# Patient Record
Sex: Male | Born: 2000
Health system: Southern US, Community
[De-identification: ages and names within clinical notes are randomized; demographics above are authoritative.]

## PROBLEM LIST (undated history)

## (undated) DIAGNOSIS — E119 Type 2 diabetes mellitus without complications: Secondary | ICD-10-CM

## (undated) HISTORY — DX: Type 2 diabetes mellitus without complications: E11.9

---
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
## 2021-02-18 ENCOUNTER — Telehealth (INDEPENDENT_AMBULATORY_CARE_PROVIDER_SITE_OTHER): Payer: Self-pay | Admitting: Family

## 2021-02-26 ENCOUNTER — Telehealth (INDEPENDENT_AMBULATORY_CARE_PROVIDER_SITE_OTHER): Payer: Self-pay

## 2021-02-26 NOTE — Telephone Encounter (Signed)
Faxed prescription for dexcom supplies to [facility]. 02/26/2021

## 2021-02-28 ENCOUNTER — Telehealth (INDEPENDENT_AMBULATORY_CARE_PROVIDER_SITE_OTHER): Payer: Self-pay | Admitting: Family

## 2021-02-28 NOTE — Telephone Encounter (Signed)
Returned call. LVM with call back number.  

## 2021-02-28 NOTE — Telephone Encounter (Signed)
Who's calling (name and relationship to patient) :[facility]   Best contact number:[phone]  Provider they JKD:TOIZTIW Beasely   Reason for call:Missing a diagnosis code for the Dexcom supplies. Please call for verbal [phone] or fax to - [phone]    Call ID:      PRESCRIPTION REFILL ONLY  Name of prescription:  Pharmacy:

## 2021-03-04 NOTE — Telephone Encounter (Signed)
Called and left message stating that they still do not have Dx code.  Mitzi Davenport was caller. Number: [phone]

## 2021-03-04 NOTE — Telephone Encounter (Signed)
LVM on [facility] VM and advised ICD 10 code of E10.9.

## 2021-04-07 ENCOUNTER — Ambulatory Visit (INDEPENDENT_AMBULATORY_CARE_PROVIDER_SITE_OTHER): Payer: 59 | Admitting: Family

## 2021-04-18 ENCOUNTER — Ambulatory Visit (INDEPENDENT_AMBULATORY_CARE_PROVIDER_SITE_OTHER): Payer: 59 | Admitting: Family

## 2021-04-18 ENCOUNTER — Other Ambulatory Visit: Payer: Self-pay

## 2021-04-18 ENCOUNTER — Encounter (INDEPENDENT_AMBULATORY_CARE_PROVIDER_SITE_OTHER): Payer: Self-pay | Admitting: Family

## 2021-04-18 VITALS — BP 114/70 | HR 72 | Wt 156.4 lb

## 2021-04-18 DIAGNOSIS — Z9641 Presence of insulin pump (external) (internal): Secondary | ICD-10-CM | POA: Diagnosis not present

## 2021-04-18 DIAGNOSIS — E10649 Type 1 diabetes mellitus with hypoglycemia without coma: Secondary | ICD-10-CM | POA: Diagnosis not present

## 2021-04-18 DIAGNOSIS — R739 Hyperglycemia, unspecified: Secondary | ICD-10-CM

## 2021-04-18 DIAGNOSIS — E1065 Type 1 diabetes mellitus with hyperglycemia: Secondary | ICD-10-CM

## 2021-04-18 DIAGNOSIS — E109 Type 1 diabetes mellitus without complications: Secondary | ICD-10-CM

## 2021-04-18 LAB — POCT GLYCOSYLATED HEMOGLOBIN (HGB A1C): Hemoglobin A1C: 6 % — AB (ref 4.0–5.6)

## 2021-04-18 LAB — POCT GLUCOSE (DEVICE FOR HOME USE): POC Glucose: 165 mg/dl — AB (ref 70–99)

## 2021-04-18 NOTE — Progress Notes (Signed)
\Pediatric Endocrinology Consultation Initial Visit  [place], [place] April 19, 2001  Danella Penton, MD  Chief Complaint: Type 1 diabetes  History obtained from: patient and mother, and review of records from PCP  HPI: Raymond [name]  is a 20 y.o. male being seen in consultation at the request of  Danella Penton, MD for evaluation of Type 1 Diabetes.  he is accompanied to this visit by his Mother.   Raymond [name] presents today to transfer care to Pediatric Specialist. In the past he has been care for at [facility]. He was diagnosed on 08/08/2007, he was initially on combination long acting and raipid acting insulin injections. He transitioned to insulin pump therapy 1 year after diagnosis. He feels that his diabetes has always been well controlled and he is very independent with his care. He is currently using Medtronic 670g insulin pump, he does not like the insulin pump and refuses to use the Guardian sensor.    2. Since his last visit to clinic on 12/2020 , Raymond [name] reports that he has been generally healthy.   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   Using Tandem Tslim insulin pump and Dexcom CGM. He reports things have been going well until one week ago when he had failed insulin pump site. Low blood sugars have been rare other then when he gets very active at work. He boluses before eating most of the time and is doing well with carb counting.    Insulin regimen: Medtronic insulin pump   Basal Rates 12AM 0.50  3am 0.65  10p 0.50           Insulin to Carbohydrate Ratio 12AM 6  10Am 6  5pm 6          Insulin Sensitivity Factor 12AM 50  3am 40  10pm 50          Target Blood Glucose 12AM 110                   Hypoglycemia: Sometimes he is unable to feel low blood sugars. Usually feels them when under 60. No glucagon  Insulin Pump download and   Dexcom CGM Download   Med-alert ID: Not currently  wearing. Injection sites: abdomen only   Annual labs due: 08/2021  Ophthalmology due: 2019 Discussed that he is overdue today and importance of yearly exam.      2. ROS: Greater than 10 systems reviewed with pertinent positives listed in HPI, otherwise neg. Constitutional: Sleeping well. Good energy and appetite.  Eyes: No changes in vision. No blurry vision. No glasses.  Ears/Nose/Mouth/Throat: No difficulty swallowing. No neck pain  Cardiovascular: No palpitations. No chest pain  Respiratory: No increased work of breathing. No SOB  Gastrointestinal: No constipation or diarrhea. No abdominal pain Genitourinary: No nocturia, no polyuria Musculoskeletal: No joint pain Neurologic: Normal sensation, no tremor Endocrine: No polyuria or polydipsia.  Psychiatric: Normal affect. Denies depression and anxiety.    Past Medical History:  Past Medical History:  Diagnosis Date   Diabetes mellitus without complication ([place])     Birth History: Pregnancy uncomplicated. Delivered at term Discharged home with mom  Meds: Outpatient Encounter Medications as of 04/18/2021  Medication Sig Note   insulin aspart (NOVOLOG) 100 UNIT/ML injection Use up 75 units per day    Glucagon (BAQSIMI TWO PACK) 3 MG/DOSE POWD Use in nostril in case of low blood sugar emergency (Patient not  taking: No sig reported)    Glucagon, rDNA, (GLUCAGON EMERGENCY) 1 MG KIT INJECT 1 MG IM ONCE (Patient not taking: No sig reported) 05/20/2020: PRN emergencies   glucose blood test strip USE TO TEST 6 TIMES DAILY AS DIRECTED (Patient not taking: No sig reported)    glucose blood test strip USE TO TEST 6 TIMES DAILY AS DIRECTED (Patient not taking: No sig reported)    Insulin Disposable Pump (OMNIPOD STARTER) KIT Use. Medtronic 670G (Patient not taking: No sig reported)    insulin glargine (LANTUS SOLOSTAR) 100 UNIT/ML Solostar Pen USE INCASE OF PUMP FAILURE INJECT UP TO 50 UNITS DAILY (Patient not taking: No sig reported)     Insulin Syringe-Needle U-100 31G X 15/64" 0.3 ML MISC Up to 4 injections per day if pump fails. (Patient not taking: No sig reported)    No facility-administered encounter medications on file as of 04/18/2021.    Allergies: No Known Allergies  Surgical History: No past surgical history on file.  Family History:  Family History  Problem Relation Age of Onset   Hyperlipidemia Mother    Cancer Maternal Grandmother    Cancer Maternal Grandfather     Social History: Lives with: Mother, Father and two siblings.  Freshman at [employer]   Physical Exam:  Vitals:   04/18/21 1014  BP: 114/70  Pulse: 72  Weight: 156 lb 6.4 oz (70.9 kg)    BP 114/70 (BP Location: Right Arm, Patient Position: Sitting, Cuff Size: Normal)   Pulse 72   Wt 156 lb 6.4 oz (70.9 kg)   BMI 24.32 kg/m  Body mass index: body mass index is 24.32 kg/m. Blood pressure percentiles are not available for patients who are 18 years or older.  Wt Readings from Last 3 Encounters:  04/18/21 156 lb 6.4 oz (70.9 kg) (57 %, Z= 0.17)*  12/24/20 147 lb 12.8 oz (67 kg) (45 %, Z= -0.13)*  10/17/20 150 lb 9.6 oz (68.3 kg) (51 %, Z= 0.02)*   * Growth percentiles are based on CDC (Boys, 2-20 Years) data.   Ht Readings from Last 3 Encounters:  10/17/20 5' 7.24" (1.708 m) (22 %, Z= -0.78)*  09/11/20 5' 7.17" (1.706 m) (21 %, Z= -0.80)*  05/20/20 5' 7.13" (1.705 m) (21 %, Z= -0.79)*   * Growth percentiles are based on CDC (Boys, 2-20 Years) data.   Body mass index is 24.32 kg/m. '@BMIFA' @ 57 %ile (Z= 0.17) based on CDC (Boys, 2-20 Years) weight-for-age data using vitals from 04/18/2021. No height on file for this encounter.   Physical Exam   General: Well developed, well nourished male in no acute distress.  Head: Normocephalic, atraumatic.   Eyes:  Pupils equal and round. EOMI.  Sclera white.  No eye drainage.   Ears/Nose/Mouth/Throat: Nares patent, no nasal drainage.  Normal dentition, mucous membranes moist.  Neck:  supple, no cervical lymphadenopathy, no thyromegaly Cardiovascular: regular rate, normal S1/S2, no murmurs Respiratory: No increased work of breathing.  Lungs clear to auscultation bilaterally.  No wheezes. Abdomen: soft, nontender, nondistended. Normal bowel sounds.  No appreciable masses  Extremities: warm, well perfused, cap refill < 2 sec.   Musculoskeletal: Normal muscle mass.  Normal strength Skin: warm, dry.  No rash or lesions. Neurologic: alert and oriented, normal speech, no tremor   Laboratory Evaluation: Hemoglobin A1c: 6.4% on 12/2020 Results for orders placed or performed in visit on 04/18/21  POCT glycosylated hemoglobin (Hb A1C)  Result Value Ref Range   Hemoglobin A1C 6.0 (A) 4.0 -  5.6 %   HbA1c POC (<> result, manual entry)     HbA1c, POC (prediabetic range)     HbA1c, POC (controlled diabetic range)    POCT Glucose (Device for Home Use)  Result Value Ref Range   Glucose Fasting, POC     POC Glucose 165 (A) 70 - 99 mg/dl       Assessment/Plan: Raymond [name] is a 20 y.o. male with type 1 diabetes in excellent control on Tandem Tslim insulin pump and Dexcom CGM. Doing an excellent job with diabetes control. Hemoglobin A1c is 6% today and TIR is >70%.   1-3. Type 1 diabetes without complication(HCC)/hyperglycemia/Hypoglycemia  - Reviewed insulin pump and CGM download. Discussed trends and patterns.  - Rotate pump sites to prevent scar tissue.  - bolus 15 minutes prior to eating to limit blood sugar spikes.  - Reviewed carb counting and importance of accurate carb counting.  - Discussed signs and symptoms of hypoglycemia. Always have glucose available.  - POCT glucose and hemoglobin A1c  - Reviewed growth chart.  - Discussed activity and work with diabetes and balancing blood sugars.   4. Insulin pump in place/titration  - No changes today. Pump in place.   Follow-up:   3 months.    >30 spent today reviewing the medical chart, counseling the patient/family,  and documenting today's visit.    When a patient is on insulin, intensive monitoring of blood glucose levels is necessary to avoid hyperglycemia and hypoglycemia. Severe hyperglycemia/hypoglycemia can lead to hospital admissions and be life threatening.   Hermenia Bers,  FNP-C  Pediatric Specialist  [address] [place]  [place], [address]  Tele: [phone]

## 2021-04-30 ENCOUNTER — Telehealth (INDEPENDENT_AMBULATORY_CARE_PROVIDER_SITE_OTHER): Payer: Self-pay | Admitting: Family

## 2021-04-30 NOTE — Telephone Encounter (Signed)
Returned call to patient, tried moving his site around and is still having bad sites.  Its as if the insulin is not being delivered to the site and all "dumps" out when he pulls the site out.  Patient currently uses Autosoft 90's. He said the autosofts work with the sites in his abdomen but Spenser wants him to rotate to more sites.   Spoke with [employer], he is ok with him trying tru steel sites.  Patient will try these sites, if they do not work he will make an appointment with Dr. Ladona Ridgel to help troubleshoot further.  Will leave samples up front for patient.

## 2021-04-30 NOTE — Telephone Encounter (Signed)
ERROR

## 2021-04-30 NOTE — Telephone Encounter (Signed)
  Who's calling (name and relationship to patient) :self / Ricki Rodriguez contact number:[phone]  Provider they KBT:CYELYHT Dalbert Garnet   Reason for call:Ancel called in requesting a call back to discuss some issues that he is having regarding his pump. Caller stated that is was to much to elaborate on and needed to speak with the doctor. Please advise.      PRESCRIPTION REFILL ONLY  Name of prescription:  Pharmacy:

## 2021-06-03 ENCOUNTER — Ambulatory Visit (INDEPENDENT_AMBULATORY_CARE_PROVIDER_SITE_OTHER): Payer: 59 | Admitting: Family

## 2021-06-20 ENCOUNTER — Telehealth (INDEPENDENT_AMBULATORY_CARE_PROVIDER_SITE_OTHER): Payer: Self-pay | Admitting: Pediatrics

## 2021-06-20 DIAGNOSIS — E109 Type 1 diabetes mellitus without complications: Secondary | ICD-10-CM

## 2021-06-20 MED ORDER — TRESIBA 100 UNIT/ML ~~LOC~~ SOLN: SUBCUTANEOUS | 1 refills | Status: DC

## 2021-06-20 MED ORDER — INSULIN GLARGINE 100 UNIT/ML ~~LOC~~ SOLN: SUBCUTANEOUS | 1 refills | Status: DC

## 2021-06-20 NOTE — Telephone Encounter (Addendum)
Received call from mom- Raymond [name]'s pump broke and he needs to know what to do.  Tandem will overnight a replacement pump that will be there tomorrow.  Called pharmacy ([facility] at [phone] in [place])- lantus vials need prior auth; tresiba vial is approved by insurance (he has syringes so prefers vial instead of insulin pen).  Sent rx for tresiba vial - take 14 units once daily in case of pump failure with 1 refill.  Called mom back to let her know that Tresiba vials were preferred and pharmacy has this in stock and will be getting it ready for him.  Mom notes he has novolog vials.  Current novolog settings from pump: Target 110 Carb ratio 1:6 units Correction factor 1:40 Will start him on a 150/50/8 plan   Sent the following email to [facility]: Tacy Dura, While you are waiting for your replacement pump, you should take the following insulin doses:  Tresiba (long acting insulin)- take 14 units as soon as you pick this up from pharmacy tonight.  Take every 24 hours (likely only once if tandem ships your new pump and you get it tomorrow)  Novolog- take at meals using the following charts (add up the number of units for food dose and correction) Food Dose Table Grams of Carbs Rapid-acting Insulin units  Grams of Carbs Rapid-acting Insulin units  0-5 0  57-64 8  6-8 1  65-72 9  9-16 2  73-80 10  17-24 3  81-88 11  25-32 4  89-96 12  33-40 5  97-104 13  41-48 6  105-112 14  49-56 7  113-119 15   Correction Dose Table    Blood Sugar Rapid-acting Insulin units  Blood Sugar Rapid-acting Insulin units     351-400 5  80-150 0  401-450 6  151-200 1  451-500 7  201-250 2  501-550 8  251-300 3  551-600 9  301-350 4  Hi (>600) 10   Here are your most recent pump settings:   I would wait until the afternoon tomorrow to restart your pump since the long acting insulin dose tonight lasts 24 hours. Please call me if you need help with your pump settings once your new pump arrives.   Raymond Companion, MD Pediatric Endocrinology  [facility], [facility] [phone]

## 2021-06-24 NOTE — Telephone Encounter (Signed)
[facility] Call ID: [id number]

## 2021-09-10 ENCOUNTER — Other Ambulatory Visit: Payer: Self-pay

## 2021-09-10 ENCOUNTER — Encounter (INDEPENDENT_AMBULATORY_CARE_PROVIDER_SITE_OTHER): Payer: Self-pay | Admitting: Family

## 2021-09-10 ENCOUNTER — Ambulatory Visit (INDEPENDENT_AMBULATORY_CARE_PROVIDER_SITE_OTHER): Payer: 59 | Admitting: Family

## 2021-09-10 VITALS — BP 110/68 | HR 74 | Wt 156.8 lb

## 2021-09-10 DIAGNOSIS — Z9641 Presence of insulin pump (external) (internal): Secondary | ICD-10-CM | POA: Diagnosis not present

## 2021-09-10 DIAGNOSIS — E109 Type 1 diabetes mellitus without complications: Secondary | ICD-10-CM | POA: Diagnosis not present

## 2021-09-10 LAB — POCT GLYCOSYLATED HEMOGLOBIN (HGB A1C): Hemoglobin A1C: 6 % — AB (ref 4.0–5.6)

## 2021-09-10 LAB — POCT GLUCOSE (DEVICE FOR HOME USE): POC Glucose: 277 mg/dl — AB (ref 70–99)

## 2021-09-10 NOTE — Patient Instructions (Signed)
It was a pleasure seeing you in clinic today. Please do not hesitate to contact me if you have questions or concerns.  ° °Please sign up for MyChart. This is a communication tool that allows you to send an email directly to me. This can be used for questions, prescriptions and blood sugar reports. We will also release labs to you with instructions on MyChart. Please do not use MyChart if you need immediate or emergency assistance. Ask our wonderful front office staff if you need assistance.  ° °

## 2021-09-10 NOTE — Progress Notes (Signed)
\Pediatric Endocrinology Consultation Initial Visit  Raymond, [name] 2001-04-16  Danella Penton, MD  Chief Complaint: Type 1 diabetes  History obtained from: patient and mother, and review of records from PCP  HPI: Raymond [name]  is a 20 y.o. male being seen in consultation at the request of  Danella Penton, MD for evaluation of Type 1 Diabetes.  he is accompanied to this visit by his Mother.   Raymond [name] presents today to transfer care to Pediatric Specialist. In the past he has been care for at [facility]. He was diagnosed on 08/08/2007, he was initially on combination long acting and raipid acting insulin injections. He transitioned to insulin pump therapy 1 year after diagnosis. He feels that his diabetes has always been well controlled and he is very independent with his care. He is currently using Medtronic 670g insulin pump, he does not like the insulin pump and refuses to use the Guardian sensor.    2. Since his last visit to clinic on 04/2021 , Raymond [name] reports that he has been generally healthy.   He is doing well in his freshman year of college, plans to stay in dorms next year but he is unsure if he will be forced to move into an apartment. He is going to the gym daily for exercise and has started running.   He is using Tslim insulin pump and Dexcom CGM, reports it is working well overall. He has had trouble with failed pump sites and got demos of sites from tandem. He reports that he feels it takes over 30 minutes for his blood sugars to come down when he boluses and feels it should come down faster. He tends to go low when he stacks insulin trying to get blood sugars down.   Concerns:  - Trouble with sites. Has poor absorption other then on stomach.   Insulin regimen: Medtronic insulin pump   Basal Rates 12AM 0.60   3am 0.65             Insulin to Carbohydrate Ratio 12AM 6  10Am 6  5pm 6          Insulin Sensitivity Factor 12AM 50  3am 40  10pm 50           Target Blood Glucose 12AM 110                   Hypoglycemia: Sometimes he is unable to feel low blood sugars. Usually feels them when under 60. No glucagon  Insulin Pump download and   Dexcom CGM Download   Med-alert ID: Not currently wearing. Injection sites: abdomen only   Annual labs due: 08/2021  Ophthalmology due: 2019 Discussed that he is overdue today and importance of yearly exam.      2. ROS: Greater than 10 systems reviewed with pertinent positives listed in HPI, otherwise neg. Constitutional: Sleeping well. Good energy and appetite.  Eyes: No changes in vision. No blurry vision. No glasses.  Ears/Nose/Mouth/Throat: No difficulty swallowing. No neck pain  Cardiovascular: No palpitations. No chest pain  Respiratory: No increased work of breathing. No SOB  Gastrointestinal: No constipation or diarrhea. No abdominal pain Genitourinary: No nocturia, no polyuria Musculoskeletal: No joint pain Neurologic: Normal sensation, no tremor Endocrine: No polyuria or polydipsia.  Psychiatric: Normal affect. Denies depression and anxiety.    Past Medical History:  Past Medical History:  Diagnosis Date   Diabetes mellitus without complication ([place])     Birth History: Pregnancy uncomplicated. Delivered at term Discharged home  with mom  Meds: Outpatient Encounter Medications as of 09/10/2021  Medication Sig Note   insulin aspart (NOVOLOG) 100 UNIT/ML injection Inject into the skin.    Glucagon (BAQSIMI TWO PACK) 3 MG/DOSE POWD Use in nostril in case of low blood sugar emergency (Patient not taking: Reported on 09/11/2020)    Glucagon, rDNA, (GLUCAGON EMERGENCY) 1 MG KIT INJECT 1 MG IM ONCE (Patient not taking: Reported on 02/19/2020) 05/20/2020: PRN emergencies   glucose blood test strip USE TO TEST 6 TIMES DAILY AS DIRECTED (Patient not taking: No sig reported)    glucose blood test strip USE TO TEST 6 TIMES DAILY AS DIRECTED (Patient not taking: No sig reported)     insulin aspart (NOVOLOG) 100 UNIT/ML injection Use up 75 units per day (Patient not taking: Reported on 09/10/2021)    Insulin Degludec (TRESIBA) 100 UNIT/ML SOLN Inject 14 units every 24 hours in case of pump failure (Patient not taking: Reported on 09/10/2021)    Insulin Disposable Pump (OMNIPOD STARTER) KIT Use. Medtronic 670G (Patient not taking: No sig reported)    insulin glargine (LANTUS SOLOSTAR) 100 UNIT/ML Solostar Pen USE INCASE OF PUMP FAILURE INJECT UP TO 50 UNITS DAILY (Patient not taking: Reported on 09/11/2020)    Insulin Syringe-Needle U-100 31G X 15/64" 0.3 ML MISC Up to 4 injections per day if pump fails. (Patient not taking: Reported on 09/11/2020)    No facility-administered encounter medications on file as of 09/10/2021.    Allergies: No Known Allergies  Surgical History: No past surgical history on file.  Family History:  Family History  Problem Relation Age of Onset   Hyperlipidemia Mother    Cancer Maternal Grandmother    Cancer Maternal Grandfather     Social History: Lives with: Mother, Father and two siblings.  Freshman at [employer]   Physical Exam:  Vitals:   09/10/21 1112  BP: 110/68  Pulse: 74  Weight: 156 lb 12.8 oz (71.1 kg)     BP 110/68 (BP Location: Right Arm, Patient Position: Sitting, Cuff Size: Normal)   Pulse 74   Wt 156 lb 12.8 oz (71.1 kg)   BMI 24.38 kg/m  Body mass index: body mass index is 24.38 kg/m. Blood pressure percentiles are not available for patients who are 18 years or older.  Wt Readings from Last 3 Encounters:  09/10/21 156 lb 12.8 oz (71.1 kg) (55 %, Z= 0.12)*  04/18/21 156 lb 6.4 oz (70.9 kg) (57 %, Z= 0.17)*  12/24/20 147 lb 12.8 oz (67 kg) (45 %, Z= -0.13)*   * Growth percentiles are based on CDC (Boys, 2-20 Years) data.   Ht Readings from Last 3 Encounters:  10/17/20 5' 7.24" (1.708 m) (22 %, Z= -0.78)*  09/11/20 5' 7.17" (1.706 m) (21 %, Z= -0.80)*  05/20/20 5' 7.13" (1.705 m) (21 %, Z= -0.79)*   *  Growth percentiles are based on CDC (Boys, 2-20 Years) data.   Body mass index is 24.38 kg/m. '@BMIFA' @ 55 %ile (Z= 0.12) based on CDC (Boys, 2-20 Years) weight-for-age data using vitals from 09/10/2021. No height on file for this encounter.   Physical Exam   General: Well developed, well nourished male in no acute distress.   Head: Normocephalic, atraumatic.   Eyes:  Pupils equal and round. EOMI.  Sclera white.  No eye drainage.   Ears/Nose/Mouth/Throat: Nares patent, no nasal drainage.  Normal dentition, mucous membranes moist.  Neck: supple, no cervical lymphadenopathy, no thyromegaly Cardiovascular: regular rate, normal S1/S2, no murmurs Respiratory:  No increased work of breathing.  Lungs clear to auscultation bilaterally.  No wheezes. Abdomen: soft, nontender, nondistended. Normal bowel sounds.  No appreciable masses  Extremities: warm, well perfused, cap refill < 2 sec.   Musculoskeletal: Normal muscle mass.  Normal strength Skin: warm, dry.  No rash or lesions. Neurologic: alert and oriented, normal speech, no tremor  Laboratory Evaluation: Hemoglobin A1c: 6% on 04/2021 Results for orders placed or performed in visit on 09/10/21  POCT Glucose (Device for Home Use)  Result Value Ref Range   Glucose Fasting, POC     POC Glucose 277 (A) 70 - 99 mg/dl       Assessment/Plan: Raymond [name] is a 20 y.o. male with type 1 diabetes in excellent control on Tandem Tslim insulin pump and Dexcom CGM. He is having anxiety related to hyperglycemia. Discussed extensively that althought Novolog/Humalog start working in 15 minutes, it can take 3-4 hours for the insulin to be out of his system. Overall, his blood sugars have been well controlled with TIR 68%. His hemoglobin A1c is 6% which meets the ADA goal of <7%.    1-3. Type 1 diabetes without complication(HCC)/hyperglycemia/Hypoglycemia  - Reviewed insulin pump and CGM download. Discussed trends and patterns.  - Rotate pump sites to  prevent scar tissue.  - bolus 15 minutes prior to eating to limit blood sugar spikes.  - Reviewed carb counting and importance of accurate carb counting.  - Discussed signs and symptoms of hypoglycemia. Always have glucose available.  - POCT glucose and hemoglobin A1c  - Reviewed growth chart.  - Discussed insulin action time and when to be concerned for failed pump site. Discussed pump site options - Discussed Omnipod 5 insulin pump.   4. Insulin pump in place/titration  - No changes today. Pump in place.  - Discussed bolusing before eating, giving insulin time to work.   Follow-up:   3 months.    >45 spent today reviewing the medical chart, counseling the patient/family, and documenting today's visit.  When a patient is on insulin, intensive monitoring of blood glucose levels is necessary to avoid hyperglycemia and hypoglycemia. Severe hyperglycemia/hypoglycemia can lead to hospital admissions and be life threatening.   Hermenia Bers,  FNP-C  Pediatric Specialist  [address] [place]  [place], [address]  Tele: [phone]

## 2021-09-15 ENCOUNTER — Telehealth (INDEPENDENT_AMBULATORY_CARE_PROVIDER_SITE_OTHER): Payer: Self-pay | Admitting: Family

## 2021-09-15 MED ORDER — INSULIN ASPART 100 UNIT/ML IJ SOLN: INTRAMUSCULAR | 4 refills | Status: DC

## 2021-09-15 NOTE — Telephone Encounter (Signed)
  Who's calling (name and relationship to patient) :Mom/ Lorianne   Best contact number:[phone]  Provider they BTY:OMAYOKH Beasely   Reason for call:caller stated that he is running out of medication due to only getting 2 bottles when he usally gets 4 and will be out by Friday. Nameer is at school and needs Novalog sent to the pharmacy listed below.      PRESCRIPTION REFILL ONLY  Name of prescription:Novalog Insulin   Pharmacy:[facility].   [address] [place], [place]   [phone]

## 2021-10-09 ENCOUNTER — Telehealth (INDEPENDENT_AMBULATORY_CARE_PROVIDER_SITE_OTHER): Payer: Self-pay | Admitting: Family

## 2021-10-09 NOTE — Telephone Encounter (Signed)
°  Who's calling (name and relationship to patient) : Fleet Contras with [employer]  Best contact number: [phone]  Provider they see: Gretchen Short  Reason for call: Needs to speak with clinical staff regarding order for supplies.    PRESCRIPTION REFILL ONLY  Name of prescription:  Pharmacy:

## 2021-10-09 NOTE — Telephone Encounter (Signed)
Spoke with [employer] rep. The number that was provided. They couldn't find a patient with that name DOB or phone number.

## 2021-12-23 ENCOUNTER — Ambulatory Visit (INDEPENDENT_AMBULATORY_CARE_PROVIDER_SITE_OTHER): Payer: 59 | Admitting: Family

## 2021-12-23 NOTE — Progress Notes (Incomplete)
\Pediatric Endocrinology Consultation Initial Visit  Raymond [name], Raymond [name] [birth year]  Danella Penton, MD  Chief Complaint: Type 1 diabetes  History obtained from: patient and mother, and review of records from PCP  HPI: Raymond [name]  is a 21 y.o. male being seen in consultation at the request of  Danella Penton, MD for evaluation of Type 1 Diabetes.  he is accompanied to this visit by his Mother.   Raymond [name] presents today to transfer care to Pediatric Specialist. In the past he has been care for at [facility]. He was diagnosed on 08/08/2007, he was initially on combination long acting and raipid acting insulin injections. He transitioned to insulin pump therapy 1 year after diagnosis. He feels that his diabetes has always been well controlled and he is very independent with his care. He is currently using Medtronic 670g insulin pump, he does not like the insulin pump and refuses to use the Guardian sensor.    2. Since his last visit to clinic on 08/2021 , Raymond [name] reports that he has been generally healthy.   He is doing well in his freshman year of college, plans to stay in dorms next year but he is unsure if he will be forced to move into an apartment. He is going to the gym daily for exercise and has started running.   He is using Tslim insulin pump and Dexcom CGM, reports it is working well overall. He has had trouble with failed pump sites and got demos of sites from tandem. He reports that he feels it takes over 30 minutes for his blood sugars to come down when he boluses and feels it should come down faster. He tends to go low when he stacks insulin trying to get blood sugars down.   Concerns:  - Trouble with sites. Has poor absorption other then on stomach.   Insulin regimen: Medtronic insulin pump   Basal Rates 12AM 0.60   3am 0.65             Insulin to Carbohydrate Ratio 12AM 6  10Am 6  5pm 6          Insulin Sensitivity Factor 12AM 50  3am 40  10pm 50           Target Blood Glucose 12AM 110                   Hypoglycemia: Sometimes he is unable to feel low blood sugars. Usually feels them when under 60. No glucagon  Insulin Pump download and   Dexcom CGM Download   Med-alert ID: Not currently wearing. Injection sites: abdomen only   Annual labs due: 08/2021  Ophthalmology due: 2019 Discussed that he is overdue today and importance of yearly exam.      2. ROS: Greater than 10 systems reviewed with pertinent positives listed in HPI, otherwise neg. Constitutional: Sleeping well. Good energy and appetite.  Eyes: No changes in vision. No blurry vision. No glasses.  Ears/Nose/Mouth/Throat: No difficulty swallowing. No neck pain  Cardiovascular: No palpitations. No chest pain  Respiratory: No increased work of breathing. No SOB  Gastrointestinal: No constipation or diarrhea. No abdominal pain Genitourinary: No nocturia, no polyuria Musculoskeletal: No joint pain Neurologic: Normal sensation, no tremor Endocrine: No polyuria or polydipsia.  Psychiatric: Normal affect. Denies depression and anxiety.    Past Medical History:  Past Medical History:  Diagnosis Date   Diabetes mellitus without complication ([place])     Birth History: Pregnancy uncomplicated. Delivered at term Discharged home  with mom  Meds: Outpatient Encounter Medications as of 12/23/2021  Medication Sig Note   Glucagon (BAQSIMI TWO PACK) 3 MG/DOSE POWD Use in nostril in case of low blood sugar emergency (Patient not taking: Reported on 09/11/2020)    Glucagon, rDNA, (GLUCAGON EMERGENCY) 1 MG KIT INJECT 1 MG IM ONCE (Patient not taking: Reported on 02/19/2020) 05/20/2020: PRN emergencies   glucose blood test strip USE TO TEST 6 TIMES DAILY AS DIRECTED (Patient not taking: No sig reported)    glucose blood test strip USE TO TEST 6 TIMES DAILY AS DIRECTED (Patient not taking: No sig reported)    insulin aspart (NOVOLOG) 100 UNIT/ML injection Use up 75 units per day (Patient  not taking: Reported on 09/10/2021)    insulin aspart (NOVOLOG) 100 UNIT/ML injection Use up to 300 units in pump every 48 hours    Insulin Degludec (TRESIBA) 100 UNIT/ML SOLN Inject 14 units every 24 hours in case of pump failure (Patient not taking: Reported on 09/10/2021)    Insulin Disposable Pump (OMNIPOD STARTER) KIT Use. Medtronic 670G (Patient not taking: No sig reported)    insulin glargine (LANTUS SOLOSTAR) 100 UNIT/ML Solostar Pen USE INCASE OF PUMP FAILURE INJECT UP TO 50 UNITS DAILY (Patient not taking: Reported on 09/11/2020)    Insulin Syringe-Needle U-100 31G X 15/64" 0.3 ML MISC Up to 4 injections per day if pump fails. (Patient not taking: Reported on 09/11/2020)    No facility-administered encounter medications on file as of 12/23/2021.    Allergies: No Known Allergies  Surgical History: No past surgical history on file.  Family History:  Family History  Problem Relation Age of Onset   Hyperlipidemia Mother    Cancer Maternal Grandmother    Cancer Maternal Grandfather     Social History: Lives with: Mother, Father and two siblings.  Freshman at [employer]   Physical Exam:  There were no vitals filed for this visit.    There were no vitals taken for this visit. Body mass index: body mass index is unknown because there is no height or weight on file. Blood pressure percentiles are not available for patients who are 18 years or older.  Wt Readings from Last 3 Encounters:  09/10/21 156 lb 12.8 oz (71.1 kg) (55 %, Z= 0.12)*  04/18/21 156 lb 6.4 oz (70.9 kg) (57 %, Z= 0.17)*  12/24/20 147 lb 12.8 oz (67 kg) (45 %, Z= -0.13)*   * Growth percentiles are based on CDC (Boys, 2-20 Years) data.   Ht Readings from Last 3 Encounters:  10/17/20 5' 7.24" (1.708 m) (22 %, Z= -0.78)*  09/11/20 5' 7.17" (1.706 m) (21 %, Z= -0.80)*  05/20/20 5' 7.13" (1.705 m) (21 %, Z= -0.79)*   * Growth percentiles are based on CDC (Boys, 2-20 Years) data.   There is no height or weight on  file to calculate BMI. '@BMIFA' @ No weight on file for this encounter. No height on file for this encounter.   Physical Exam   General: Well developed, well nourished male in no acute distress. Head: Normocephalic, atraumatic.   Eyes:  Pupils equal and round. EOMI.  Sclera white.  No eye drainage.   Ears/Nose/Mouth/Throat: Nares patent, no nasal drainage.  Normal dentition, mucous membranes moist.  Neck: supple, no cervical lymphadenopathy, no thyromegaly Cardiovascular: regular rate, normal S1/S2, no murmurs Respiratory: No increased work of breathing.  Lungs clear to auscultation bilaterally.  No wheezes. Abdomen: soft, nontender, nondistended. Normal bowel sounds.  No appreciable masses  Extremities:  warm, well perfused, cap refill < 2 sec.   Musculoskeletal: Normal muscle mass.  Normal strength Skin: warm, dry.  No rash or lesions. Neurologic: alert and oriented, normal speech, no tremor  Laboratory Evaluation: Hemoglobin A1c: 6% on 08/2021 Results for orders placed or performed in visit on 09/10/21  POCT glycosylated hemoglobin (Hb A1C)  Result Value Ref Range   Hemoglobin A1C 6.0 (A) 4.0 - 5.6 %   HbA1c POC (<> result, manual entry)     HbA1c, POC (prediabetic range)     HbA1c, POC (controlled diabetic range)    POCT Glucose (Device for Home Use)  Result Value Ref Range   Glucose Fasting, POC     POC Glucose 277 (A) 70 - 99 mg/dl       Assessment/Plan: Raymond [name] is a 21 y.o. male with type 1 diabetes in excellent control on Tandem Tslim insulin pump and Dexcom CGM. He is having anxiety related to hyperglycemia. Discussed extensively that althought Novolog/Humalog start working in 15 minutes, it can take 3-4 hours for the insulin to be out of his system. Overall, his blood sugars have been well controlled with TIR 68%. His hemoglobin A1c is 6% which meets the ADA goal of <7%.    1-3. Type 1 diabetes without complication(HCC)/hyperglycemia/Hypoglycemia  - Reviewed  insulin pump and CGM download. Discussed trends and patterns.  - Rotate pump sites to prevent scar tissue.  - bolus 15 minutes prior to eating to limit blood sugar spikes.  - Reviewed carb counting and importance of accurate carb counting.  - Discussed signs and symptoms of hypoglycemia. Always have glucose available.  - POCT glucose and hemoglobin A1c  - Reviewed growth chart.    4. Insulin pump in place/titration  - No changes today. Pump in place.  - Discussed bolusing before eating, giving insulin time to work.   Follow-up:   3 months.    >45 spent today reviewing the medical chart, counseling the patient/family, and documenting today's visit.  When a patient is on insulin, intensive monitoring of blood glucose levels is necessary to avoid hyperglycemia and hypoglycemia. Severe hyperglycemia/hypoglycemia can lead to hospital admissions and be life threatening.   Raymond Bers,  FNP-C  Pediatric Specialist  [address] [place]  [place], [address]  Tele: [phone]

## 2022-01-07 ENCOUNTER — Other Ambulatory Visit (INDEPENDENT_AMBULATORY_CARE_PROVIDER_SITE_OTHER): Payer: Self-pay | Admitting: Family

## 2022-04-20 ENCOUNTER — Other Ambulatory Visit (INDEPENDENT_AMBULATORY_CARE_PROVIDER_SITE_OTHER): Payer: Self-pay

## 2022-04-20 ENCOUNTER — Ambulatory Visit (INDEPENDENT_AMBULATORY_CARE_PROVIDER_SITE_OTHER): Payer: 59 | Admitting: Family

## 2022-04-20 ENCOUNTER — Encounter (INDEPENDENT_AMBULATORY_CARE_PROVIDER_SITE_OTHER): Payer: Self-pay | Admitting: Family

## 2022-04-20 VITALS — BP 108/60 | HR 64 | Wt 152.0 lb

## 2022-04-20 DIAGNOSIS — Z4681 Encounter for fitting and adjustment of insulin pump: Secondary | ICD-10-CM | POA: Diagnosis not present

## 2022-04-20 DIAGNOSIS — E1065 Type 1 diabetes mellitus with hyperglycemia: Secondary | ICD-10-CM

## 2022-04-20 DIAGNOSIS — E109 Type 1 diabetes mellitus without complications: Secondary | ICD-10-CM

## 2022-04-20 LAB — POCT GLUCOSE (DEVICE FOR HOME USE): POC Glucose: 183 mg/dl — AB (ref 70–99)

## 2022-04-20 MED ORDER — BAQSIMI TWO PACK 3 MG/DOSE NA POWD: NASAL | 2 refills | Status: AC

## 2022-04-20 NOTE — Progress Notes (Signed)
\Pediatric Endocrinology Consultation Initial Visit  Raymond [name], Raymond [name] [birth year]  Danella Penton, MD  Chief Complaint: Type 1 diabetes  History obtained from: patient and mother, and review of records from PCP  HPI: Raymond [name]  is a 21 y.o. male being seen in consultation at the request of  Danella Penton, MD for evaluation of Type 1 Diabetes.  he is accompanied to this visit by his Mother.   Raymond [name] presents today to transfer care to Pediatric Specialist. In the past he has been care for at [facility]. He was diagnosed on 08/08/2007, he was initially on combination long acting and raipid acting insulin injections. He transitioned to insulin pump therapy 1 year after diagnosis. He feels that his diabetes has always been well controlled and he is very independent with his care. He is currently using Medtronic 670g insulin pump, he does not like the insulin pump and refuses to use the Guardian sensor.    2. Since his last visit to clinic on 08/2021 , Raymond [name] reports that he has been generally healthy.   He reports school went well this year. He plans to move into an apartment with 3 roommates this fall. He has been lifting weights and doing rock climbing in his free time.   Reports diabetes has been going well overall, somewhat average. Using Tandem Tslim and Dexcom CGM which are working well. He was having lows when summer first started but they have decreased lately. Lows usually occurred during lunch time while working. He boluses before eating most of the time. He does not feel like blood sugars have been running high often unless he under boluses or forgets to bolus.    Concerns:  - Trouble with sites. Has poor absorption other then on stomach.   Insulin regimen: Medtronic insulin pump   Basal Rates 12AM 0.60   3am 0.80           19 units per day.   Insulin to Carbohydrate Ratio 12AM 6  3Am 6              Insulin Sensitivity Factor 12AM 50  3am 40             Target Blood Glucose 12AM 110                   Hypoglycemia: Sometimes he is unable to feel low blood sugars. Usually feels them when under 60. No glucagon  Insulin Pump download and   Dexcom CGM Download   Med-alert ID: Not currently wearing. Injection sites: abdomen only   Annual labs due: Ordered  Ophthalmology due: 2023. Discussed importance of annual eye exam.      2. ROS: Greater than 10 systems reviewed with pertinent positives listed in HPI, otherwise neg. Constitutional: Sleeping well. Weight stable.  Eyes: No changes in vision. No blurry vision. No glasses.  Ears/Nose/Mouth/Throat: No difficulty swallowing. No neck pain  Cardiovascular: No palpitations. No chest pain  Respiratory: No increased work of breathing. No SOB  Gastrointestinal: No constipation or diarrhea. No abdominal pain Genitourinary: No nocturia, no polyuria Musculoskeletal: No joint pain Neurologic: Normal sensation, no tremor Endocrine: No polyuria or polydipsia.  Psychiatric: Normal affect. Denies depression and anxiety.    Past Medical History:  Past Medical History:  Diagnosis Date   Diabetes mellitus without complication ([place])     Birth History: Pregnancy uncomplicated. Delivered at term Discharged home with mom  Meds: Outpatient Encounter Medications as of 04/20/2022  Medication Sig Note   NOVOLOG 100  UNIT/ML injection INJECT UP TO 75 UNITS DAILY    Glucagon, rDNA, (GLUCAGON EMERGENCY) 1 MG KIT INJECT 1 MG IM ONCE (Patient not taking: Reported on 02/19/2020) 05/20/2020: PRN emergencies   glucose blood test strip USE TO TEST 6 TIMES DAILY AS DIRECTED (Patient not taking: No sig reported)    glucose blood test strip USE TO TEST 6 TIMES DAILY AS DIRECTED (Patient not taking: No sig reported)    insulin aspart (NOVOLOG) 100 UNIT/ML injection Use up to 300 units in pump every 48 hours (Patient not taking: Reported on 04/20/2022)    Insulin Degludec (TRESIBA) 100 UNIT/ML SOLN Inject 14 units  every 24 hours in case of pump failure (Patient not taking: Reported on 09/10/2021)    Insulin Disposable Pump (OMNIPOD STARTER) KIT Use. Medtronic 670G (Patient not taking: No sig reported)    insulin glargine (LANTUS SOLOSTAR) 100 UNIT/ML Solostar Pen USE INCASE OF PUMP FAILURE INJECT UP TO 50 UNITS DAILY (Patient not taking: Reported on 09/11/2020)    Insulin Syringe-Needle U-100 31G X 15/64" 0.3 ML MISC Up to 4 injections per day if pump fails. (Patient not taking: Reported on 09/11/2020)    [DISCONTINUED] Glucagon (BAQSIMI TWO PACK) 3 MG/DOSE POWD Use in nostril in case of low blood sugar emergency (Patient not taking: Reported on 09/11/2020)    No facility-administered encounter medications on file as of 04/20/2022.    Allergies: No Known Allergies  Surgical History: No past surgical history on file.  Family History:  Family History  Problem Relation Age of Onset   Hyperlipidemia Mother    Cancer Maternal Grandmother    Cancer Maternal Grandfather     Social History: Lives with: Mother, Father and two siblings.  Freshman at [employer]   Physical Exam:  Vitals:   04/20/22 1125  BP: 108/60  Pulse: 64  Weight: 152 lb (68.9 kg)      BP 108/60   Pulse 64   Wt 152 lb (68.9 kg)   BMI 23.63 kg/m  Body mass index: body mass index is 23.63 kg/m. Blood pressure %iles are not available for patients who are 18 years or older.  Wt Readings from Last 3 Encounters:  04/20/22 152 lb (68.9 kg) (44 %, Z= -0.14)*  09/10/21 156 lb 12.8 oz (71.1 kg) (55 %, Z= 0.12)*  04/18/21 156 lb 6.4 oz (70.9 kg) (57 %, Z= 0.17)*   * Growth percentiles are based on CDC (Boys, 2-20 Years) data.   Ht Readings from Last 3 Encounters:  10/17/20 5' 7.24" (1.708 m) (22 %, Z= -0.78)*  09/11/20 5' 7.17" (1.706 m) (21 %, Z= -0.80)*  05/20/20 5' 7.13" (1.705 m) (21 %, Z= -0.79)*   * Growth percentiles are based on CDC (Boys, 2-20 Years) data.   Body mass index is 23.63 kg/m. '@BMIFA' @ 44 %ile (Z= -0.14)  based on CDC (Boys, 2-20 Years) weight-for-age data using vitals from 04/20/2022. No height on file for this encounter.   Physical Exam   General: Well developed, well nourished male in no acute distress.  Head: Normocephalic, atraumatic.   Eyes:  Pupils equal and round. EOMI.  Sclera white.  No eye drainage.   Ears/Nose/Mouth/Throat: Nares patent, no nasal drainage.  Normal dentition, mucous membranes moist.  Neck: supple, no cervical lymphadenopathy, no thyromegaly Cardiovascular: regular rate, normal S1/S2, no murmurs Respiratory: No increased work of breathing.  Lungs clear to auscultation bilaterally.  No wheezes. Abdomen: soft, nontender, nondistended. Normal bowel sounds.  No appreciable masses  Extremities: warm, well  perfused, cap refill < 2 sec.   Musculoskeletal: Normal muscle mass.  Normal strength Skin: warm, dry.  No rash or lesions. Neurologic: alert and oriented, normal speech, no tremor   Laboratory Evaluation: Hemoglobin A1c: 6% on 08/2021 Results for orders placed or performed in visit on 04/20/22  POCT Glucose (Device for Home Use)  Result Value Ref Range   Glucose Fasting, POC     POC Glucose 183 (A) 70 - 99 mg/dl       Assessment/Plan: Raymond [name] is a 21 y.o. male with type 1 diabetes in excellent control on Tandem Tslim insulin pump and Dexcom CGM. Tslim download shows patterns of post prandial hypoglycemia likely due to increase activity at work. Will decrease carb ratio today. His TIR is 72% which  meets goal of >70%. Due for annual labs and hemoglobin A1c today.     1-3. Type 1 diabetes without complication(HCC)/hyperglycemia/Hypoglycemia  - Reviewed insulin pump and CGM download. Discussed trends and patterns.  - Rotate pump sites to prevent scar tissue.  - bolus 15 minutes prior to eating to limit blood sugar spikes.  - Reviewed carb counting and importance of accurate carb counting.  - Discussed signs and symptoms of hypoglycemia. Always have  glucose available.  - POCT glucose and hemoglobin A1c  - Reviewed growth chart.  - Discussed new technology including Dexcom G7 and beta bionics iLet  - Upgrade pump with Tandem Mobile bolus.  - Lipid panel, TFTs, microalbumin and hemoglobin A1c ordered.   4. Insulin pump in place/titration  nsulin to Carbohydrate Ratio 12AM 6  3Am 6 --> 8              Follow-up:   3 months.   LOS: >40 spent today reviewing the medical chart, counseling the patient/family, and documenting today's visit.   When a patient is on insulin, intensive monitoring of blood glucose levels is necessary to avoid hyperglycemia and hypoglycemia. Severe hyperglycemia/hypoglycemia can lead to hospital admissions and be life threatening.   Hermenia Bers,  FNP-C  Pediatric Specialist  [address] [place]  [place], [address]  Tele: [phone]

## 2022-04-20 NOTE — Patient Instructions (Addendum)
nsulin to Carbohydrate Ratio 12AM 6  3Am 6 --> 8             - Tandem mobile bolus   It was a pleasure seeing you in clinic today. Please do not hesitate to contact me if you have questions or concerns.   Please sign up for MyChart. This is a communication tool that allows you to send an email directly to me. This can be used for questions, prescriptions and blood sugar reports. We will also release labs to you with instructions on MyChart. Please do not use MyChart if you need immediate or emergency assistance. Ask our wonderful front office staff if you need assistance.

## 2022-04-21 LAB — LIPID PANEL
Cholesterol: 158 mg/dL (ref <170)
HDL: 41 mg/dL — ABNORMAL LOW (ref >45)
LDL Cholesterol (Calc): 103 mg/dL (calc) (ref <110)
Non-HDL Cholesterol (Calc): 117 mg/dL (calc) (ref <120)
Total CHOL/HDL Ratio: 3.9 (calc) (ref <5.0)
Triglycerides: 57 mg/dL (ref <90)

## 2022-04-21 LAB — HEMOGLOBIN A1C
Hgb A1c MFr Bld: 6.3 % of total Hgb — ABNORMAL HIGH (ref <5.7)
Mean Plasma Glucose: 134 mg/dL
eAG (mmol/L): 7.4 mmol/L

## 2022-04-21 LAB — MICROALBUMIN / CREATININE URINE RATIO
Creatinine, Urine: 165 mg/dL (ref 20–320)
Microalb Creat Ratio: 2 mcg/mg creat (ref <30)
Microalb, Ur: 0.4 mg/dL

## 2022-04-21 LAB — TSH: TSH: 1.56 mIU/L (ref 0.50–4.30)

## 2022-04-21 LAB — T4, FREE: Free T4: 1.2 ng/dL (ref 0.8–1.4)

## 2022-04-24 NOTE — Progress Notes (Signed)
Lvm with call back number

## 2022-04-27 ENCOUNTER — Telehealth (INDEPENDENT_AMBULATORY_CARE_PROVIDER_SITE_OTHER): Payer: Self-pay | Admitting: Family

## 2022-04-27 NOTE — Telephone Encounter (Signed)
Who's calling (name and relationship to patient) : Raymond [name]; mom  Best contact number: [phone]  Provider they see: Dalbert Garnet   Reason for call: Mom called in stating she got a call not sure from who regarding results  Call ID:      PRESCRIPTION REFILL ONLY  Name of prescription:  Pharmacy:

## 2022-04-27 NOTE — Telephone Encounter (Signed)
Spoke with mom. Gave results.  

## 2022-07-24 ENCOUNTER — Other Ambulatory Visit (INDEPENDENT_AMBULATORY_CARE_PROVIDER_SITE_OTHER): Payer: Self-pay | Admitting: Family

## 2022-07-31 ENCOUNTER — Ambulatory Visit (INDEPENDENT_AMBULATORY_CARE_PROVIDER_SITE_OTHER): Payer: 59 | Admitting: Family

## 2022-08-05 ENCOUNTER — Telehealth (INDEPENDENT_AMBULATORY_CARE_PROVIDER_SITE_OTHER): Payer: Self-pay | Admitting: Family

## 2022-08-05 DIAGNOSIS — E1065 Type 1 diabetes mellitus with hyperglycemia: Secondary | ICD-10-CM

## 2022-08-05 MED ORDER — DEXCOM G6 SENSOR MISC: 5 refills | Status: DC

## 2022-08-05 NOTE — Telephone Encounter (Signed)
  Name of who is calling: Lori  Caller's Relationship to Patient: mom  Best contact number: [phone]  Provider they see: Hermenia Bers   Reason for call: Mom is calling because [employer] dexcom supplies went out early. He is needing a refill sent to [facility].      PRESCRIPTION REFILL ONLY  Name of prescription: Dexcom sensor  Pharmacy: [facility]

## 2022-08-05 NOTE — Telephone Encounter (Signed)
Sent In script, called mom to confirm it was only the sensors and they had called Dexcom.   Per mom he called Dexcom and they are sending a replacement but he has used his last one and insurance can't refill til next week.  She asked if she can pay out of pocket at the [facility] - I told her she should be able to but I'm not sure how that works.  I also suggested that she go to the Dexcom site to see about the co pay card if she is going to pay out of pocket.  I explained I did not know the details but it could be worth looking into before paying for it out of pocket. She verbalized understanding.

## 2022-08-24 ENCOUNTER — Ambulatory Visit (INDEPENDENT_AMBULATORY_CARE_PROVIDER_SITE_OTHER): Payer: 59 | Admitting: Family

## 2022-10-06 ENCOUNTER — Ambulatory Visit (INDEPENDENT_AMBULATORY_CARE_PROVIDER_SITE_OTHER): Payer: 59 | Admitting: Family

## 2022-10-06 ENCOUNTER — Encounter (INDEPENDENT_AMBULATORY_CARE_PROVIDER_SITE_OTHER): Payer: Self-pay | Admitting: Family

## 2022-10-06 VITALS — BP 108/62 | HR 66 | Wt 152.3 lb

## 2022-10-06 DIAGNOSIS — E1065 Type 1 diabetes mellitus with hyperglycemia: Secondary | ICD-10-CM

## 2022-10-06 DIAGNOSIS — E10649 Type 1 diabetes mellitus with hypoglycemia without coma: Secondary | ICD-10-CM

## 2022-10-06 DIAGNOSIS — Z4681 Encounter for fitting and adjustment of insulin pump: Secondary | ICD-10-CM | POA: Diagnosis not present

## 2022-10-06 LAB — POCT GLYCOSYLATED HEMOGLOBIN (HGB A1C): Hemoglobin A1C: 7.2 % — AB (ref 4.0–5.6)

## 2022-10-06 LAB — POCT GLUCOSE (DEVICE FOR HOME USE): POC Glucose: 162 mg/dl — AB (ref 70–99)

## 2022-10-06 MED ORDER — INSULIN ASPART 100 UNIT/ML IJ SOLN: INTRAMUSCULAR | 4 refills | Status: DC

## 2022-10-06 NOTE — Progress Notes (Signed)
\Pediatric Endocrinology Consultation Initial Visit  Beauden, Tremont 02-Mar-2001  Danella Penton, MD  Chief Complaint: Type 1 diabetes  History obtained from: patient and mother, and review of records from PCP  HPI: Raymond [name]  is a 21 y.o. male being seen in consultation at the request of  Danella Penton, MD for evaluation of Type 1 Diabetes.  he is accompanied to this visit by his Mother.   Raymond [name] presents today to transfer care to Pediatric Specialist. In the past he has been care for at [facility]. He was diagnosed on 08/08/2007, he was initially on combination long acting and raipid acting insulin injections. He transitioned to insulin pump therapy 1 year after diagnosis. He feels that his diabetes has always been well controlled and he is very independent with his care. He is currently using Medtronic 670g insulin pump, he does not like the insulin pump and refuses to use the Guardian sensor.    2. Since his last visit to clinic on 04/2022 , Raymond [name] reports that he has been generally healthy.   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 Also go promoted to head life guard. He is going to the gym a few days per week for activity.   Using Tslim insulin pump and Dexcom CGM. He recently updated his pump his pump and can use the Dexcom G7. He boluses before eating, does well estimating carbs. Mainly uses abdomen for pump sites. Hypoglycemia is rare, he is able to feel when low. No glucagon needed.    Insulin regimen: Medtronic insulin pump   Basal Rates 12AM 0.60   3am 0.80           18.75  units per day.   Insulin to Carbohydrate Ratio 12AM 6  3Am 8             Insulin Sensitivity Factor 12AM 50  3am 40            Target Blood Glucose 12AM 110                   Hypoglycemia: Sometimes he is unable to feel low blood sugars. Usually feels them when under 60. No glucagon  Insulin Pump download and   Dexcom CGM Download    Med-alert ID: Not currently wearing. Injection sites: abdomen only   Annual labs due: 04/2023 Ophthalmology due: 2023. Discussed importance of annual eye exam.      2. ROS: Greater than 10 systems reviewed with pertinent positives listed in HPI, otherwise neg. Constitutional: Sleeping well. Weight stable.  Eyes: No changes in vision. No blurry vision. No glasses.  Ears/Nose/Mouth/Throat: No difficulty swallowing. No neck pain  Cardiovascular: No palpitations. No chest pain  Respiratory: No increased work of breathing. No SOB  Gastrointestinal: No constipation or diarrhea. No abdominal pain Genitourinary: No nocturia, no polyuria Musculoskeletal: No joint pain Neurologic: Normal sensation, no tremor Endocrine: No polyuria or polydipsia.  Psychiatric: Normal affect. Denies depression and anxiety.    Past Medical History:  Past Medical History:  Diagnosis Date   Diabetes mellitus without complication ([place])     Birth History: Pregnancy uncomplicated. Delivered at term Discharged home with mom  Meds: Outpatient Encounter Medications as of 10/06/2022  Medication Sig Note   Continuous Blood Gluc Sensor (DEXCOM G6 SENSOR) MISC Change every 10 days    Glucagon (BAQSIMI TWO PACK) 3 MG/DOSE POWD Use in nostril in case of low blood sugar emergency    insulin aspart (  NOVOLOG) 100 UNIT/ML injection Use up to 300 units in pump every 48 hours    Glucagon, rDNA, (GLUCAGON EMERGENCY) 1 MG KIT INJECT 1 MG IM ONCE (Patient not taking: Reported on 02/19/2020) 05/20/2020: PRN emergencies   glucose blood test strip USE TO TEST 6 TIMES DAILY AS DIRECTED (Patient not taking: No sig reported)    glucose blood test strip USE TO TEST 6 TIMES DAILY AS DIRECTED (Patient not taking: No sig reported)    Insulin Degludec (TRESIBA) 100 UNIT/ML SOLN Inject 14 units every 24 hours in case of pump failure (Patient not taking: Reported on 09/10/2021)    Insulin Disposable Pump (OMNIPOD STARTER) KIT Use.  Medtronic 670G (Patient not taking: No sig reported)    insulin glargine (LANTUS SOLOSTAR) 100 UNIT/ML Solostar Pen USE INCASE OF PUMP FAILURE INJECT UP TO 50 UNITS DAILY (Patient not taking: Reported on 09/11/2020)    Insulin Syringe-Needle U-100 31G X 15/64" 0.3 ML MISC Up to 4 injections per day if pump fails. (Patient not taking: Reported on 09/11/2020)    NOVOLOG 100 UNIT/ML injection INJECT UP TO 75 UNITS INTO THE SKIN DAILY. (Patient not taking: Reported on 10/06/2022)    No facility-administered encounter medications on file as of 10/06/2022.    Allergies: No Known Allergies  Surgical History: No past surgical history on file.  Family History:  Family History  Problem Relation Age of Onset   Hyperlipidemia Mother    Cancer Maternal Grandmother    Cancer Maternal Grandfather     Social History: Lives with: Mother, Father and two siblings.  Freshman at [employer]   Physical Exam:  Vitals:   10/06/22 1448  BP: 108/62  Pulse: 66  Weight: 152 lb 4.8 oz (69.1 kg)       BP 108/62   Pulse 66   Wt 152 lb 4.8 oz (69.1 kg)   BMI 23.68 kg/m  Body mass index: body mass index is 23.68 kg/m. Growth %ile SmartLinks can only be used for patients less than 50 years old.  Wt Readings from Last 3 Encounters:  10/06/22 152 lb 4.8 oz (69.1 kg)  04/20/22 152 lb (68.9 kg) (44 %, Z= -0.14)*  09/10/21 156 lb 12.8 oz (71.1 kg) (55 %, Z= 0.12)*   * Growth percentiles are based on CDC (Boys, 2-20 Years) data.   Ht Readings from Last 3 Encounters:  10/17/20 5' 7.24" (1.708 m) (22 %, Z= -0.78)*  09/11/20 5' 7.17" (1.706 m) (21 %, Z= -0.80)*  05/20/20 5' 7.13" (1.705 m) (21 %, Z= -0.79)*   * Growth percentiles are based on CDC (Boys, 2-20 Years) data.   Body mass index is 23.68 kg/m. _0 @ Facility age limit for growth %iles is 20 years. Facility age limit for growth %iles is 20 years.   Physical Exam   General: Well developed, well nourished male in no acute distress.   Head: Normocephalic, atraumatic.   Eyes:  Pupils equal and round. EOMI.  Sclera white.  No eye drainage.   Ears/Nose/Mouth/Throat: Nares patent, no nasal drainage.  Normal dentition, mucous membranes moist.  Neck: supple, no cervical lymphadenopathy, no thyromegaly Cardiovascular: regular rate, normal S1/S2, no murmurs Respiratory: No increased work of breathing.  Lungs clear to auscultation bilaterally.  No wheezes. Abdomen: soft, nontender, nondistended. Normal bowel sounds.  No appreciable masses  Extremities: warm, well perfused, cap refill < 2 sec.   Musculoskeletal: Normal muscle mass.  Normal strength Skin: warm, dry.  No rash or lesions. Neurologic: alert and oriented, normal  speech, no tremor    Laboratory Evaluation: Hemoglobin A1c: 6.3% on 04/2022  Results for orders placed or performed in visit on 10/06/22  POCT glycosylated hemoglobin (Hb A1C)  Result Value Ref Range   Hemoglobin A1C 7.2 (A) 4.0 - 5.6 %   HbA1c POC (<> result, manual entry)     HbA1c, POC (prediabetic range)     HbA1c, POC (controlled diabetic range)    POCT Glucose (Device for Home Use)  Result Value Ref Range   Glucose Fasting, POC     POC Glucose 162 (A) 70 - 99 mg/dl       Assessment/Plan: Raymond [name] is a 21 y.o. male with type 1 diabetes in excellent control on Tandem Tslim insulin pump and Dexcom CGM. Has a pattern of hyperglycemia between 12pm-3am. Hemgolobin A1c is 7.2% today which is higher then ADA goal of <7%.    1-3. Type 1 diabetes without complication(HCC)/hyperglycemia/Hypoglycemia  - Reviewed insulin pump and CGM download. Discussed trends and patterns.  - Rotate pump sites to prevent scar tissue.  - bolus 15 minutes prior to eating to limit blood sugar spikes.  - Reviewed carb counting and importance of accurate carb counting.  - Discussed signs and symptoms of hypoglycemia. Always have glucose available.  - POCT glucose and hemoglobin A1c  - Reviewed growth chart.  -  Advised to contact his DME supplies for Dexcom G7 supplies.  - Discussed diabetes tech including Tandem Mobi.  - Declined influenza vaccine today.   4. Insulin pump in place/titration  Basal Rates 12AM 0.65--> 0.72  3am 0.80           18.96  units per day.   - If blood sugars run high after meals, change carb ratio from 1:8 to 1:7.   Follow-up:   3 months.   LOS: >40  spent today reviewing the medical chart, counseling the patient/family, and documenting today's visit. When a patient is on insulin, intensive monitoring of blood glucose levels is necessary to avoid hyperglycemia and hypoglycemia. Severe hyperglycemia/hypoglycemia can lead to hospital admissions and be life threatening.   Hermenia Bers,  FNP-C  Pediatric Specialist  [address] [place]  [place], [address]  Tele: [phone]

## 2022-10-06 NOTE — Patient Instructions (Addendum)
It was a pleasure seeing you in clinic today. Please do not hesitate to contact me if you have questions or concerns.   Please sign up for MyChart. This is a communication tool that allows you to send an email directly to me. This can be used for questions, prescriptions and blood sugar reports. We will also release labs to you with instructions on MyChart. Please do not use MyChart if you need immediate or emergency assistance. Ask our wonderful front office staff if you need assistance.   Basal Rates 12AM 0.65--> 0.72  3am 0.80           18.96  units per day.   - Consider changing carb ratio from 1:8 to 1:7    We hope you had a 5 star experience today! At [facility], we are committed to providing exceptional care. You will receive a patient satisfaction survey through text or email regarding your visit today. Your opinion is important to me. Comments are appreciated.

## 2022-11-18 ENCOUNTER — Telehealth (INDEPENDENT_AMBULATORY_CARE_PROVIDER_SITE_OTHER): Payer: Self-pay | Admitting: Family

## 2022-11-18 NOTE — Telephone Encounter (Signed)
  Name of who is calling: Lorianne  Caller's Relationship to Patient: mom  Best contact number: [phone]  Provider they see: Hedda Slade  Reason for call: mom left a voice mail asking if we received a fax from [employer] for a upgrade to dexcom g7. She said he is going to run out of sensors soon.

## 2022-11-18 NOTE — Telephone Encounter (Signed)
Faxed

## 2022-11-26 ENCOUNTER — Telehealth (INDEPENDENT_AMBULATORY_CARE_PROVIDER_SITE_OTHER): Payer: Self-pay | Admitting: Family

## 2022-11-26 NOTE — Telephone Encounter (Signed)
  Name of who is calling: Janace Litten Relationship to Patient: self  Best contact number: [place]  Provider they see: [phone]  Reason for call: would like for Spenser to give him a call back      [place]  Name of prescription:  Pharmacy:

## 2022-12-18 ENCOUNTER — Ambulatory Visit (INDEPENDENT_AMBULATORY_CARE_PROVIDER_SITE_OTHER): Payer: 59 | Admitting: Family

## 2022-12-22 ENCOUNTER — Encounter (INDEPENDENT_AMBULATORY_CARE_PROVIDER_SITE_OTHER): Payer: Self-pay | Admitting: Family

## 2022-12-22 ENCOUNTER — Ambulatory Visit (INDEPENDENT_AMBULATORY_CARE_PROVIDER_SITE_OTHER): Payer: 59 | Admitting: Family

## 2022-12-22 VITALS — BP 118/70 | HR 74 | Wt 159.8 lb

## 2022-12-22 DIAGNOSIS — Z9641 Presence of insulin pump (external) (internal): Secondary | ICD-10-CM

## 2022-12-22 DIAGNOSIS — E1065 Type 1 diabetes mellitus with hyperglycemia: Secondary | ICD-10-CM

## 2022-12-22 DIAGNOSIS — E10649 Type 1 diabetes mellitus with hypoglycemia without coma: Secondary | ICD-10-CM

## 2022-12-22 LAB — POCT GLUCOSE (DEVICE FOR HOME USE): POC Glucose: 263 mg/dl — AB (ref 70–99)

## 2022-12-22 LAB — POCT GLYCOSYLATED HEMOGLOBIN (HGB A1C): Hemoglobin A1C: 6.9 % — AB (ref 4.0–5.6)

## 2022-12-22 NOTE — Patient Instructions (Signed)
It was a pleasure seeing you in clinic today. Please do not hesitate to contact me if you have questions or concerns.  ° °Please sign up for MyChart. This is a communication tool that allows you to send an email directly to me. This can be used for questions, prescriptions and blood sugar reports. We will also release labs to you with instructions on MyChart. Please do not use MyChart if you need immediate or emergency assistance. Ask our wonderful front office staff if you need assistance.  ° °

## 2022-12-22 NOTE — Progress Notes (Signed)
\Pediatric Endocrinology Consultation Initial Visit  Raymond [name], Raymond [name] 06-07-2001  Danella Penton, MD  Chief Complaint: Type 1 diabetes  History obtained from: patient and mother, and review of records from PCP  HPI: Raymond [name]  is a 22 y.o. male being seen in consultation at the request of  Danella Penton, MD for evaluation of Type 1 Diabetes.  he is accompanied to this visit by his Mother.   Raymond [name] presents today to transfer care to Pediatric Specialist. In the past he has been care for at [facility]. He was diagnosed on 08/08/2007, he was initially on combination long acting and raipid acting insulin injections. He transitioned to insulin pump therapy 1 year after diagnosis. He feels that his diabetes has always been well controlled and he is very independent with his care. He is currently using Medtronic 670g insulin pump, he does not like the insulin pump and refuses to use the Guardian sensor.    2. Since his last visit to clinic on 09/2022 , Raymond [name] reports that he has been generally healthy.   He reports that diabetes care has improved recently. He was on prednisone for about two weeks and his blood sugars ran very high during that period. He recently changed sites to the 30 degree inset which has been working better. He also starting using Dexcom G7.   - Concerns - he has been trying to gain weight. Eating more carbs but has been entering the carb counts he previously used as habit.   Insulin regimen: Medtronic insulin pump   Basal Rates 12AM 0.72  3am 0.80           19  units per day.   Insulin to Carbohydrate Ratio 12AM 6  3Am 8             Insulin Sensitivity Factor 12AM 50  3am 40            Target Blood Glucose 12AM 110                   Hypoglycemia: Sometimes he is unable to feel low blood sugars. Usually feels them when under 60. No glucagon  Insulin Pump download and   Dexcom CGM Download    Med-alert ID: Not currently wearing. Injection  sites: abdomen only   Annual labs due: 04/2023 Ophthalmology due: 2023. Discussed importance of annual eye exam.      2. ROS: Greater than 10 systems reviewed with pertinent positives listed in HPI, otherwise neg. Constitutional: Sleeping well. + weight gain  Eyes: No changes in vision. No blurry vision. No glasses.  Ears/Nose/Mouth/Throat: No difficulty swallowing. No neck pain  Cardiovascular: No palpitations. No chest pain  Respiratory: No increased work of breathing. No SOB  Gastrointestinal: No constipation or diarrhea. No abdominal pain Genitourinary: No nocturia, no polyuria Musculoskeletal: No joint pain Neurologic: Normal sensation, no tremor Endocrine: No polyuria or polydipsia.  Psychiatric: Normal affect. Denies depression and anxiety.    Past Medical History:  Past Medical History:  Diagnosis Date   Diabetes mellitus without complication ([place])     Birth History: Pregnancy uncomplicated. Delivered at term Discharged home with mom  Meds: Outpatient Encounter Medications as of 12/22/2022  Medication Sig Note   Continuous Blood Gluc Sensor (DEXCOM G6 SENSOR) MISC Change every 10 days    Glucagon (BAQSIMI TWO PACK) 3 MG/DOSE POWD Use in nostril in case of low blood sugar emergency    insulin aspart (NOVOLOG) 100 UNIT/ML injection Use up to 300 units in  pump every 48 hours    Glucagon, rDNA, (GLUCAGON EMERGENCY) 1 MG KIT INJECT 1 MG IM ONCE (Patient not taking: Reported on 02/19/2020) 05/20/2020: PRN emergencies   glucose blood test strip USE TO TEST 6 TIMES DAILY AS DIRECTED (Patient not taking: No sig reported)    glucose blood test strip USE TO TEST 6 TIMES DAILY AS DIRECTED (Patient not taking: No sig reported)    Insulin Degludec (TRESIBA) 100 UNIT/ML SOLN Inject 14 units every 24 hours in case of pump failure (Patient not taking: Reported on 09/10/2021)    Insulin Disposable Pump (OMNIPOD STARTER) KIT Use. Medtronic 670G (Patient not taking: No sig reported)     insulin glargine (LANTUS SOLOSTAR) 100 UNIT/ML Solostar Pen USE INCASE OF PUMP FAILURE INJECT UP TO 50 UNITS DAILY (Patient not taking: Reported on 09/11/2020)    Insulin Syringe-Needle U-100 31G X 15/64" 0.3 ML MISC Up to 4 injections per day if pump fails. (Patient not taking: Reported on 09/11/2020)    NOVOLOG 100 UNIT/ML injection INJECT UP TO 75 UNITS INTO THE SKIN DAILY. (Patient not taking: Reported on 10/06/2022)    No facility-administered encounter medications on file as of 12/22/2022.    Allergies: No Known Allergies  Surgical History: No past surgical history on file.  Family History:  Family History  Problem Relation Age of Onset   Hyperlipidemia Mother    Cancer Maternal Grandmother    Cancer Maternal Grandfather     Social History: Lives with: Mother, Father and two siblings.  Freshman at [employer]   Physical Exam:  There were no vitals filed for this visit.  There were no vitals taken for this visit. Body mass index: body mass index is unknown because there is no height or weight on file. Growth %ile SmartLinks can only be used for patients less than 21 years old.  Wt Readings from Last 3 Encounters:  10/06/22 152 lb 4.8 oz (69.1 kg)  04/20/22 152 lb (68.9 kg) (44 %, Z= -0.14)*  09/10/21 156 lb 12.8 oz (71.1 kg) (55 %, Z= 0.12)*   * Growth percentiles are based on CDC (Boys, 2-20 Years) data.   Ht Readings from Last 3 Encounters:  10/17/20 5' 7.24" (1.708 m) (22 %, Z= -0.78)*  09/11/20 5' 7.17" (1.706 m) (21 %, Z= -0.80)*  05/20/20 5' 7.13" (1.705 m) (21 %, Z= -0.79)*   * Growth percentiles are based on CDC (Boys, 2-20 Years) data.   There is no height or weight on file to calculate BMI. '@BMIFA'$ @ Facility age limit for growth %iles is 20 years. Facility age limit for growth %iles is 20 years.   Physical Exam   General: Well developed, well nourished male in no acute distress.  Head: Normocephalic, atraumatic.   Eyes:  Pupils equal and round. EOMI.   Sclera white.  No eye drainage.   Ears/Nose/Mouth/Throat: Nares patent, no nasal drainage.  Normal dentition, mucous membranes moist.  Neck: supple, no cervical lymphadenopathy, no thyromegaly Cardiovascular: regular rate, normal S1/S2, no murmurs Respiratory: No increased work of breathing.  Lungs clear to auscultation bilaterally.  No wheezes. Abdomen: soft, nontender, nondistended. Normal bowel sounds.  No appreciable masses  Extremities: warm, well perfused, cap refill < 2 sec.   Musculoskeletal: Normal muscle mass.  Normal strength Skin: warm, dry.  No rash or lesions. Neurologic: alert and oriented, normal speech, no tremor   Laboratory Evaluation: Hemoglobin A1c: 7.2% on 04/2022  Results for orders placed or performed in visit on 12/22/22  POCT glycosylated  hemoglobin (Hb A1C)  Result Value Ref Range   Hemoglobin A1C 6.9 (A) 4.0 - 5.6 %   HbA1c POC (<> result, manual entry)     HbA1c, POC (prediabetic range)     HbA1c, POC (controlled diabetic range)    POCT Glucose (Device for Home Use)  Result Value Ref Range   Glucose Fasting, POC     POC Glucose 263 (A) 70 - 99 mg/dl     Assessment/Plan: Raymond [name] is a 22 y.o. male with type 1 diabetes in excellent control on Tandem Tslim insulin pump and Dexcom CGM. He is bolusing more frequently with increased carb ratio (intentional for weigh gain). Hemoglobin A1c has improved to 6.9% which meets ADA goal of <7%.   1-3. Type 1 diabetes without complication(HCC)/hyperglycemia/Hypoglycemia  - Reviewed insulin pump and CGM download. Discussed trends and patterns.  - Rotate pump sites to prevent scar tissue.  - bolus 15 minutes prior to eating to limit blood sugar spikes.  - Reviewed carb counting and importance of accurate carb counting.  - Discussed signs and symptoms of hypoglycemia. Always have glucose available.  - POCT glucose and hemoglobin A1c  - Reviewed growth chart.  - Discussed new diabetes tech including Tandem mobi  -  Reviewed carb counting and ratios. Advised to make sure he is enter enough when bolusing.   4. Insulin pump in place/titration  No changes today.   Follow-up:   3 months.   LOS: >40  spent today reviewing the medical chart, counseling the patient/family, and documenting today's visit.  When a patient is on insulin, intensive monitoring of blood glucose levels is necessary to avoid hyperglycemia and hypoglycemia. Severe hyperglycemia/hypoglycemia can lead to hospital admissions and be life threatening.   Hermenia Bers,  FNP-C  Pediatric Specialist  [address] [place]  [place], [address]  Tele: [phone]

## 2023-01-28 ENCOUNTER — Telehealth (INDEPENDENT_AMBULATORY_CARE_PROVIDER_SITE_OTHER): Payer: Self-pay | Admitting: Family

## 2023-01-28 NOTE — Telephone Encounter (Signed)
Who's calling (name and relationship to patient) : Raymond [name]; mom  Best contact number:  Provider they see: Dalbert Garnet, Np  Reason for call: Mom has dropped off Consent form for the [facility] to be filled out.   Call ID:      PRESCRIPTION REFILL ONLY  Name of prescription:  Pharmacy:

## 2023-01-29 NOTE — Telephone Encounter (Signed)
Called to let patient know that DMV ppw is ready for pick up. It has also been faxed.

## 2023-03-04 ENCOUNTER — Ambulatory Visit (INDEPENDENT_AMBULATORY_CARE_PROVIDER_SITE_OTHER): Payer: 59 | Admitting: Family

## 2023-03-04 ENCOUNTER — Encounter (INDEPENDENT_AMBULATORY_CARE_PROVIDER_SITE_OTHER): Payer: Self-pay | Admitting: Family

## 2023-03-04 VITALS — BP 108/66 | HR 72 | Wt 156.2 lb

## 2023-03-04 DIAGNOSIS — E1065 Type 1 diabetes mellitus with hyperglycemia: Secondary | ICD-10-CM | POA: Diagnosis not present

## 2023-03-04 DIAGNOSIS — Z9641 Presence of insulin pump (external) (internal): Secondary | ICD-10-CM

## 2023-03-04 NOTE — Patient Instructions (Signed)
It was a pleasure seeing you in clinic today. Please do not hesitate to contact me if you have questions or concerns.   Please sign up for MyChart. This is a communication tool that allows you to send an email directly to me. This can be used for questions, prescriptions and blood sugar reports. We will also release labs to you with instructions on MyChart. Please do not use MyChart if you need immediate or emergency assistance. Ask our wonderful front office staff if you need assistance.   - continue current pump settings.

## 2023-03-04 NOTE — Progress Notes (Signed)
\Pediatric Endocrinology Consultation Initial Visit  Raymond [name] [birth year]  Raymond Schlichter, MD  Chief Complaint: Type 1 diabetes  History obtained from: patient and mother, and review of records from PCP  HPI: Raymond [name]  is a 22 y.o. male being seen in consultation at the request of  Raymond Schlichter, MD for evaluation of Type 1 Diabetes.  he is accompanied to this visit by his Mother.   1. Raymond [name] presents today to transfer care to Pediatric Specialist. In the past he has been care for at [facility]. He was diagnosed on 08/08/2007, he was initially on combination long acting and raipid acting insulin injections. He transitioned to insulin pump therapy 1 year after diagnosis. He feels that his diabetes has always been well controlled and he is very independent with his care. He is currently using Medtronic 670g insulin pump, he does not like the insulin pump and refuses to use the Guardian sensor.    2. Since his last visit to clinic on 12/2022  , Raymond [name] reports that he has been generally healthy.   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 He is going to the gym daily for exercise.   Using Tandem tslim insulin pump and Dexcom G7. He feels like blood sugars have been better since his last visit. He boluses before eating, most of the time. Does well with carb counting, estimates 50-80 grams of carbs per meal. Hypoglycemia is no occurring often, he is able to feel symptoms when blood sugar is under 80.    Concerns:  - Sites appear to leak into 3rd day.  - Gets more false low blood sugars with G7    Insulin regimen: Medtronic insulin pump   Basal Rates 12AM 0.72  3am 0.80           19  units per day.   Insulin to Carbohydrate Ratio 12AM 6  3Am 8             Insulin Sensitivity Factor 12AM 50  3am 40            Target Blood Glucose 12AM 110                   Hypoglycemia: Sometimes he is unable to feel low blood sugars. Usually feels them  when under 60. No glucagon  Insulin Pump download and   Dexcom CGM Download    Med-alert ID: Not currently wearing. Injection sites: abdomen only   Annual labs due: 04/2023 Ophthalmology due: 2023. Discussed importance of annual eye exam.      2. ROS: Greater than 10 systems reviewed with pertinent positives listed in HPI, otherwise neg. Constitutional: Sleeping well. + 7 lbs weight gain.   Eyes: No changes in vision. No blurry vision. No glasses.  Ears/Nose/Mouth/Throat: No difficulty swallowing. No neck pain  Cardiovascular: No palpitations. No chest pain  Respiratory: No increased work of breathing. No SOB  Gastrointestinal: No constipation or diarrhea. No abdominal pain Genitourinary: No nocturia, no polyuria Musculoskeletal: No joint pain Neurologic: Normal sensation, no tremor Endocrine: No polyuria or polydipsia.  Psychiatric: Normal affect. Denies depression and anxiety.    Past Medical History:  Past Medical History:  Diagnosis Date   Diabetes mellitus without complication (HCC)     Birth History: Pregnancy uncomplicated. Delivered at term Discharged home with mom  Meds: Outpatient Encounter Medications as of 03/04/2023  Medication Sig Note   Continuous Blood Gluc Sensor (DEXCOM G6 SENSOR) MISC Change every 10 days  Glucagon (BAQSIMI TWO PACK) 3 MG/DOSE POWD Use in nostril in case of low blood sugar emergency    Glucagon, rDNA, (GLUCAGON EMERGENCY) 1 MG KIT INJECT 1 MG IM ONCE (Patient not taking: Reported on 02/19/2020) 05/20/2020: PRN emergencies   glucose blood test strip USE TO TEST 6 TIMES DAILY AS DIRECTED (Patient not taking: No sig reported)    glucose blood test strip USE TO TEST 6 TIMES DAILY AS DIRECTED (Patient not taking: No sig reported)    insulin aspart (NOVOLOG) 100 UNIT/ML injection Use up to 300 units in pump every 48 hours    Insulin Degludec (TRESIBA) 100 UNIT/ML SOLN Inject 14 units every 24 hours in case of pump failure (Patient not taking:  Reported on 09/10/2021)    Insulin Disposable Pump (OMNIPOD STARTER) KIT Use. Medtronic 670G (Patient not taking: No sig reported)    insulin glargine (LANTUS SOLOSTAR) 100 UNIT/ML Solostar Pen USE INCASE OF PUMP FAILURE INJECT UP TO 50 UNITS DAILY (Patient not taking: Reported on 09/11/2020)    Insulin Syringe-Needle U-100 31G X 15/64" 0.3 ML MISC Up to 4 injections per day if pump fails. (Patient not taking: Reported on 09/11/2020)    NOVOLOG 100 UNIT/ML injection INJECT UP TO 75 UNITS INTO THE SKIN DAILY. (Patient not taking: Reported on 10/06/2022)    No facility-administered encounter medications on file as of 03/04/2023.    Allergies: No Known Allergies  Surgical History: No past surgical history on file.  Family History:  Family History  Problem Relation Age of Onset   Hyperlipidemia Mother    Cancer Maternal Grandmother    Cancer Maternal Grandfather     Social History: Lives with: Mother, Father and two siblings.  Freshman at [employer]   Physical Exam:  There were no vitals filed for this visit.  There were no vitals taken for this visit. Body mass index: body mass index is unknown because there is no height or weight on file. Growth %ile SmartLinks can only be used for patients less than 91 years old.  Wt Readings from Last 3 Encounters:  12/22/22 159 lb 12.8 oz (72.5 kg)  10/06/22 152 lb 4.8 oz (69.1 kg)  04/20/22 152 lb (68.9 kg) (44 %, Z= -0.14)*   * Growth percentiles are based on CDC (Boys, 2-20 Years) data.   Ht Readings from Last 3 Encounters:  10/17/20 5' 7.24" (1.708 m) (22 %, Z= -0.78)*  09/11/20 5' 7.17" (1.706 m) (21 %, Z= -0.80)*  05/20/20 5' 7.13" (1.705 m) (21 %, Z= -0.79)*   * Growth percentiles are based on CDC (Boys, 2-20 Years) data.   There is no height or weight on file to calculate BMI. @BMIFA @ Facility age limit for growth %iles is 20 years. Facility age limit for growth %iles is 20 years.   Physical Exam   General: Well developed,  well nourished male in no acute distress.   Head: Normocephalic, atraumatic.   Eyes:  Pupils equal and round. EOMI.  Sclera white.  No eye drainage.   Ears/Nose/Mouth/Throat: Nares patent, no nasal drainage.  Normal dentition, mucous membranes moist.  Neck: supple, no cervical lymphadenopathy, no thyromegaly Cardiovascular: regular rate, normal S1/S2, no murmurs Respiratory: No increased work of breathing.  Lungs clear to auscultation bilaterally.  No wheezes. Abdomen: soft, nontender, nondistended. Normal bowel sounds.  No appreciable masses  Extremities: warm, well perfused, cap refill < 2 sec.   Musculoskeletal: Normal muscle mass.  Normal strength Skin: warm, dry.  No rash or lesions. Neurologic: alert  and oriented, normal speech, no tremor    Laboratory Evaluation: Hemoglobin A1c: 7.2% on 04/2022  Results for orders placed or performed in visit on 12/22/22  POCT glycosylated hemoglobin (Hb A1C)  Result Value Ref Range   Hemoglobin A1C 6.9 (A) 4.0 - 5.6 %   HbA1c POC (<> result, manual entry)     HbA1c, POC (prediabetic range)     HbA1c, POC (controlled diabetic range)    POCT Glucose (Device for Home Use)  Result Value Ref Range   Glucose Fasting, POC     POC Glucose 263 (A) 70 - 99 mg/dl     Assessment/Plan: Kebin Merrick is a 22 y.o. male with type 1 diabetes in excellent control on Tandem Tslim insulin pump and Dexcom CGM. Blood glucose control has improved since last visit. His time in range has increased to 64%. Hypoglycemia occurs following multiple boluses if he feels blood sugars are not coming down fast enough, counseled to allow 2-3 hours for rapid acting insulin to work and prevent stacking insulin.    1-3. Type 1 diabetes without complication(HCC)/hyperglycemia/Hypoglycemia  - Reviewed insulin pump and CGM download. Discussed trends and patterns.  - Rotate pump sites to prevent scar tissue.  - bolus 15 minutes prior to eating to limit blood sugar spikes.  -  Reviewed carb counting and importance of accurate carb counting.  - Discussed signs and symptoms of hypoglycemia. Always have glucose available.  - POCT glucose and hemoglobin A1c  - Reviewed growth chart.  - Discussed options for insulin pump sites> he will continue using inset 30. Encouraged to rotate pump site areas.  - Discussed diabetes tech including tandem mobi   4. Insulin pump in place/titration  Pump in place.   Follow-up:   3 months.   LOS:>40  spent today reviewing the medical chart, counseling the patient/family, and documenting today's visit.   When a patient is on insulin, intensive monitoring of blood glucose levels is necessary to avoid hyperglycemia and hypoglycemia. Severe hyperglycemia/hypoglycemia can lead to hospital admissions and be life threatening.   Gretchen Short,  FNP-C  Pediatric Specialist  [address]  [place], [address]  Tele: [phone]

## 2023-06-04 ENCOUNTER — Ambulatory Visit (INDEPENDENT_AMBULATORY_CARE_PROVIDER_SITE_OTHER): Payer: 59 | Admitting: Family

## 2023-11-03 ENCOUNTER — Encounter (INDEPENDENT_AMBULATORY_CARE_PROVIDER_SITE_OTHER): Payer: Self-pay

## 2024-01-25 ENCOUNTER — Encounter (INDEPENDENT_AMBULATORY_CARE_PROVIDER_SITE_OTHER): Payer: Self-pay

## 2024-02-07 ENCOUNTER — Encounter (INDEPENDENT_AMBULATORY_CARE_PROVIDER_SITE_OTHER): Payer: Self-pay

## 2024-02-17 ENCOUNTER — Telehealth (INDEPENDENT_AMBULATORY_CARE_PROVIDER_SITE_OTHER): Payer: Self-pay | Admitting: Family

## 2024-02-17 ENCOUNTER — Other Ambulatory Visit (INDEPENDENT_AMBULATORY_CARE_PROVIDER_SITE_OTHER): Payer: Self-pay | Admitting: Family

## 2024-02-17 DIAGNOSIS — E1065 Type 1 diabetes mellitus with hyperglycemia: Secondary | ICD-10-CM

## 2024-02-17 MED ORDER — INSULIN ASPART 100 UNIT/ML IJ SOLN: INTRAMUSCULAR | 0 refills | Status: AC

## 2024-02-17 NOTE — Telephone Encounter (Signed)
See other telephone encounter for today.

## 2024-02-17 NOTE — Telephone Encounter (Signed)
  Name of who is calling: Christel Cousins Relationship to Patient:   Best contact number: [phone]  Provider they see:   Reason for call: refill on expired prescription pt is out, [phone]     PRESCRIPTION REFILL ONLY  Name of prescription: novolog    Pharmacy: [facility] [id number] [place]

## 2024-02-17 NOTE — Telephone Encounter (Signed)
  Name of who is calling: Savannah   Caller's Relationship to Patient:  Best contact number:[phone]  Provider they see: beasley   Reason for call: Pt called due to [facility] saying rx expired didn't receive a PA     PRESCRIPTION REFILL ONLY  Name of prescription: novolog    Pharmacy:  '

## 2024-03-28 ENCOUNTER — Telehealth (INDEPENDENT_AMBULATORY_CARE_PROVIDER_SITE_OTHER): Payer: Self-pay | Admitting: Family

## 2024-03-28 NOTE — Telephone Encounter (Signed)
 Called Raymond [name] let him know due to him not being seen in the past year he needs to go to primary care physician or urgent care. Pt was last seen 03/04/23

## 2024-03-28 NOTE — Telephone Encounter (Signed)
 Who's calling (name and relationship to patient) : Darby Shadwick, self  Best contact number: [phone]  Provider they see: Alvia Awkward, NP  Reason for call: Damauri called in wanting to speak with Spenser. He was informed that as of yesterday Spenser is no longer with our office. He wanted to speak with someone regarding a refill for his insulin . He stated that he is here until the end of the month due to school. He is requesting a call back.    Call ID:      PRESCRIPTION REFILL ONLY  Name of prescription:  Pharmacy:
